# Patient Record
Sex: Male | Born: 1990
Health system: Southern US, Community
[De-identification: ages and names within clinical notes are randomized; demographics above are authoritative.]

---

## 2017-07-30 ENCOUNTER — Ambulatory Visit (INDEPENDENT_AMBULATORY_CARE_PROVIDER_SITE_OTHER): Payer: 59

## 2017-07-30 ENCOUNTER — Ambulatory Visit: Payer: 59 | Admitting: Physician Assistant

## 2017-07-30 ENCOUNTER — Encounter: Payer: Self-pay | Admitting: Physician Assistant

## 2017-07-30 VITALS — BP 140/90 | HR 78 | Temp 98.1°F | Resp 16 | Ht 67.0 in | Wt 252.0 lb

## 2017-07-30 DIAGNOSIS — R0789 Other chest pain: Secondary | ICD-10-CM

## 2017-07-30 DIAGNOSIS — R740 Nonspecific elevation of levels of transaminase and lactic acid dehydrogenase [LDH]: Secondary | ICD-10-CM

## 2017-07-30 DIAGNOSIS — R079 Chest pain, unspecified: Secondary | ICD-10-CM | POA: Diagnosis not present

## 2017-07-30 DIAGNOSIS — R7401 Elevation of levels of liver transaminase levels: Secondary | ICD-10-CM

## 2017-07-30 LAB — D-DIMER, QUANTITATIVE (NOT AT ARMC)

## 2017-07-30 LAB — TROPONIN I

## 2017-07-30 NOTE — Patient Instructions (Addendum)
I will call you tonight if any labs come back dangerous so you can report to the ED.  If they are normal than this pain is most likely musculoskeletal, for which you can take Aleve 2 caps in the monring and 2 at night for 5 days.     IF you received an x-ray today, you will receive an invoice from [facility]. Please contact [facility] at [phone] with questions or concerns regarding your invoice.   IF you received labwork today, you will receive an invoice from [facility]. Please contact [facility] at [phone] with questions or concerns regarding your invoice.   Our billing staff will not be able to assist you with questions regarding bills from these companies.  You will be contacted with the lab results as soon as they are available. The fastest way to get your results is to activate your My Chart account. Instructions are located on the last page of this paperwork. If you have not heard from us regarding the results in 2 weeks, please contact this office.

## 2017-07-30 NOTE — Progress Notes (Signed)
07/31/2017 8:18 AM   DOB: 1990/02/22 / MRN: [id number]  SUBJECTIVE:  Ernest Wright is a 27 y.o. male presenting for chest pain with associated shortness of breath. Symptoms present for about 3 days.  The problem is no better or worse. He has tried nothing.  Family history of cardiac disease in his father at age 25.  Denies history of diabetes, dyslipidemia and is a never smoker.  Denies leg swelling, orthopnea, presyncope.  Position changes to make the pain worse and illicit shortness of breath.  States his back was hurting after a basketball game just before this pain began.  That pain was also worse with movement  He has no allergies on file.   He  has no past medical history on file.    He  reports that he has never smoked. He has never used smokeless tobacco. He  has no sexual activity history on file. The patient  has no past surgical history on file.    ROS  The problem list and medications were reviewed and updated by myself where necessary and exist elsewhere in the encounter.   OBJECTIVE:  BP 140/90   Pulse 78   Temp 98.1 F (36.7 C) (Oral)   Resp 16   Ht 5\' 7"  (1.702 m)   Wt 252 lb (114.3 kg)   SpO2 97%   BMI 39.47 kg/m   Wt Readings from Last 3 Encounters:  07/30/17 252 lb (114.3 kg)   Temp Readings from Last 3 Encounters:  07/30/17 98.1 F (36.7 C) (Oral)   BP Readings from Last 3 Encounters:  07/30/17 140/90   Pulse Readings from Last 3 Encounters:  07/30/17 78    Physical Exam  Constitutional: He is oriented to person, place, and time. He appears well-developed. He is active.  Non-toxic appearance. He does not appear ill. No distress.  Eyes: Pupils are equal, round, and reactive to light. Conjunctivae and EOM are normal.  Cardiovascular: Normal rate, regular rhythm, S1 normal, S2 normal, normal heart sounds, intact distal pulses and normal pulses. Exam reveals no gallop and no friction rub.  No murmur heard. Pulmonary/Chest: Effort normal. No stridor.  No respiratory distress. He has no wheezes. He has no rales.  Abdominal: Soft. Normal appearance and bowel sounds are normal. He exhibits no distension and no mass. There is no tenderness. There is no rigidity, no rebound, no guarding and no CVA tenderness. No hernia.  Musculoskeletal: Normal range of motion. He exhibits no edema.  Neurological: He is alert and oriented to person, place, and time. He has normal strength and normal reflexes. He is not disoriented. No cranial nerve deficit or sensory deficit. He exhibits normal muscle tone. Coordination and gait normal.  Skin: Skin is warm and dry. He is not diaphoretic. No pallor.  Psychiatric: He has a normal mood and affect. His behavior is normal.  Nursing note and vitals reviewed.   Lab Results  Component Value Date   HGBA1C 5.3 07/30/2017    Lab Results  Component Value Date   WBC 8.1 07/30/2017   HGB 14.4 07/30/2017   HCT 42.5 07/30/2017   MCV 85 07/30/2017   PLT 188 07/30/2017    Lab Results  Component Value Date   CREATININE 0.98 07/30/2017   BUN 14 07/30/2017   NA 140 07/30/2017   K 4.4 07/30/2017   CL 104 07/30/2017   CO2 23 07/30/2017    Lab Results  Component Value Date   ALT 101 (H) 07/30/2017  AST 33 07/30/2017   ALKPHOS 80 07/30/2017   BILITOT 0.7 07/30/2017    No results found for: TSH  Lab Results  Component Value Date   CHOL 170 07/30/2017   HDL 46 07/30/2017   LDLCALC 111 (H) 07/30/2017   TRIG 63 07/30/2017   CHOLHDL 3.7 07/30/2017   Lab Results  Component Value Date   TROPONINI <0.01 07/30/2017   Lab Results  Component Value Date   DDIMER <0.20 07/30/2017     Dg Chest 2 View  Result Date: 07/30/2017 CLINICAL DATA:  Chest pain EXAM: CHEST - 2 VIEW COMPARISON:  None. FINDINGS: Lungs are clear. Heart size and pulmonary vascularity are normal. No adenopathy. No pneumothorax. No bone lesions. IMPRESSION: No abnormality noted. Electronically Signed   By: Bretta Bang III M.D.   On:  07/30/2017 14:52   EKG: Normal sinus rhythm without evidence of ischemia, infarction, hypertrophy.   ASSESSMENT AND PLAN:  Ernest Wright was seen today for chest pain and shortness of breath.  Diagnoses and all orders for this visit:  Chest pain, atypical: Patient with negative d-dimer, negative troponin, never smoker, negative for diabetes with a mild elevation in his LDL.  Patient is physically activ and without anye previous exertional chest pain.  He does tell me his back was hurting just before which was worse with movement.  This is most likely musculoskeletal in origin given all of his test including his EKG are normal.  Patient advised to take Aleve for 40 twice daily -     EKG 12-Lead -     Hemoglobin A1c -     Lipid panel -     CBC -     Comprehensive metabolic panel -     DG Chest 2 View; Future -     D-dimer, quantitative (not at [facility]) -     Troponin I    The patient is advised to call or return to clinic if he does not see an improvement in symptoms, or to seek the care of the closest emergency department if he worsens with the above plan.   Deliah Boston, MHS, PA-C [facility] 07/31/2017 8:18 AM

## 2017-07-31 ENCOUNTER — Encounter: Payer: Self-pay | Admitting: Radiology

## 2017-07-31 DIAGNOSIS — R740 Nonspecific elevation of levels of transaminase and lactic acid dehydrogenase [LDH]: Secondary | ICD-10-CM | POA: Diagnosis not present

## 2017-07-31 LAB — COMPREHENSIVE METABOLIC PANEL
ALBUMIN: 4.7 g/dL (ref 3.5–5.5)
ALT: 101 IU/L — ABNORMAL HIGH (ref 0–44)
AST: 33 IU/L (ref 0–40)
Albumin/Globulin Ratio: 1.8 (ref 1.2–2.2)
Alkaline Phosphatase: 80 IU/L (ref 39–117)
BUN / CREAT RATIO: 14 (ref 9–20)
BUN: 14 mg/dL (ref 6–20)
Bilirubin Total: 0.7 mg/dL (ref 0.0–1.2)
CALCIUM: 9.7 mg/dL (ref 8.7–10.2)
CO2: 23 mmol/L (ref 20–29)
CREATININE: 0.98 mg/dL (ref 0.76–1.27)
Chloride: 104 mmol/L (ref 96–106)
GFR, EST AFRICAN AMERICAN: 123 mL/min/{1.73_m2} (ref >59)
GFR, EST NON AFRICAN AMERICAN: 107 mL/min/{1.73_m2} (ref >59)
GLOBULIN, TOTAL: 2.6 g/dL (ref 1.5–4.5)
Glucose: 85 mg/dL (ref 65–99)
Potassium: 4.4 mmol/L (ref 3.5–5.2)
SODIUM: 140 mmol/L (ref 134–144)
TOTAL PROTEIN: 7.3 g/dL (ref 6.0–8.5)

## 2017-07-31 LAB — LIPID PANEL
CHOL/HDL RATIO: 3.7 ratio (ref 0.0–5.0)
Cholesterol, Total: 170 mg/dL (ref 100–199)
HDL: 46 mg/dL (ref >39)
LDL Calculated: 111 mg/dL — ABNORMAL HIGH (ref 0–99)
TRIGLYCERIDES: 63 mg/dL (ref 0–149)
VLDL Cholesterol Cal: 13 mg/dL (ref 5–40)

## 2017-07-31 LAB — HEMOGLOBIN A1C
Est. average glucose Bld gHb Est-mCnc: 105 mg/dL
HEMOGLOBIN A1C: 5.3 % (ref 4.8–5.6)

## 2017-07-31 LAB — CBC
HEMATOCRIT: 42.5 % (ref 37.5–51.0)
HEMOGLOBIN: 14.4 g/dL (ref 13.0–17.7)
MCH: 28.8 pg (ref 26.6–33.0)
MCHC: 33.9 g/dL (ref 31.5–35.7)
MCV: 85 fL (ref 79–97)
Platelets: 188 10*3/uL (ref 150–450)
RBC: 5 x10E6/uL (ref 4.14–5.80)
RDW: 12.5 % (ref 12.3–15.4)
WBC: 8.1 10*3/uL (ref 3.4–10.8)

## 2017-07-31 NOTE — Addendum Note (Signed)
Addended by: Garfield CorneaMABRY, Ameyah Bangura L on: 07/31/2017 03:29 PM   Modules accepted: Orders

## 2017-08-01 LAB — HEPATITIS PANEL, ACUTE
HEP A IGM: NEGATIVE
HEP B C IGM: NEGATIVE
HEP B S AG: NEGATIVE

## 2019-01-15 IMAGING — DX DG CHEST 2V
2 series · 2 of 2 positions shown · non-contrast
Comparison: None.

CLINICAL DATA: Chest pain

EXAM:
CHEST - 2 VIEW

[chest pa]
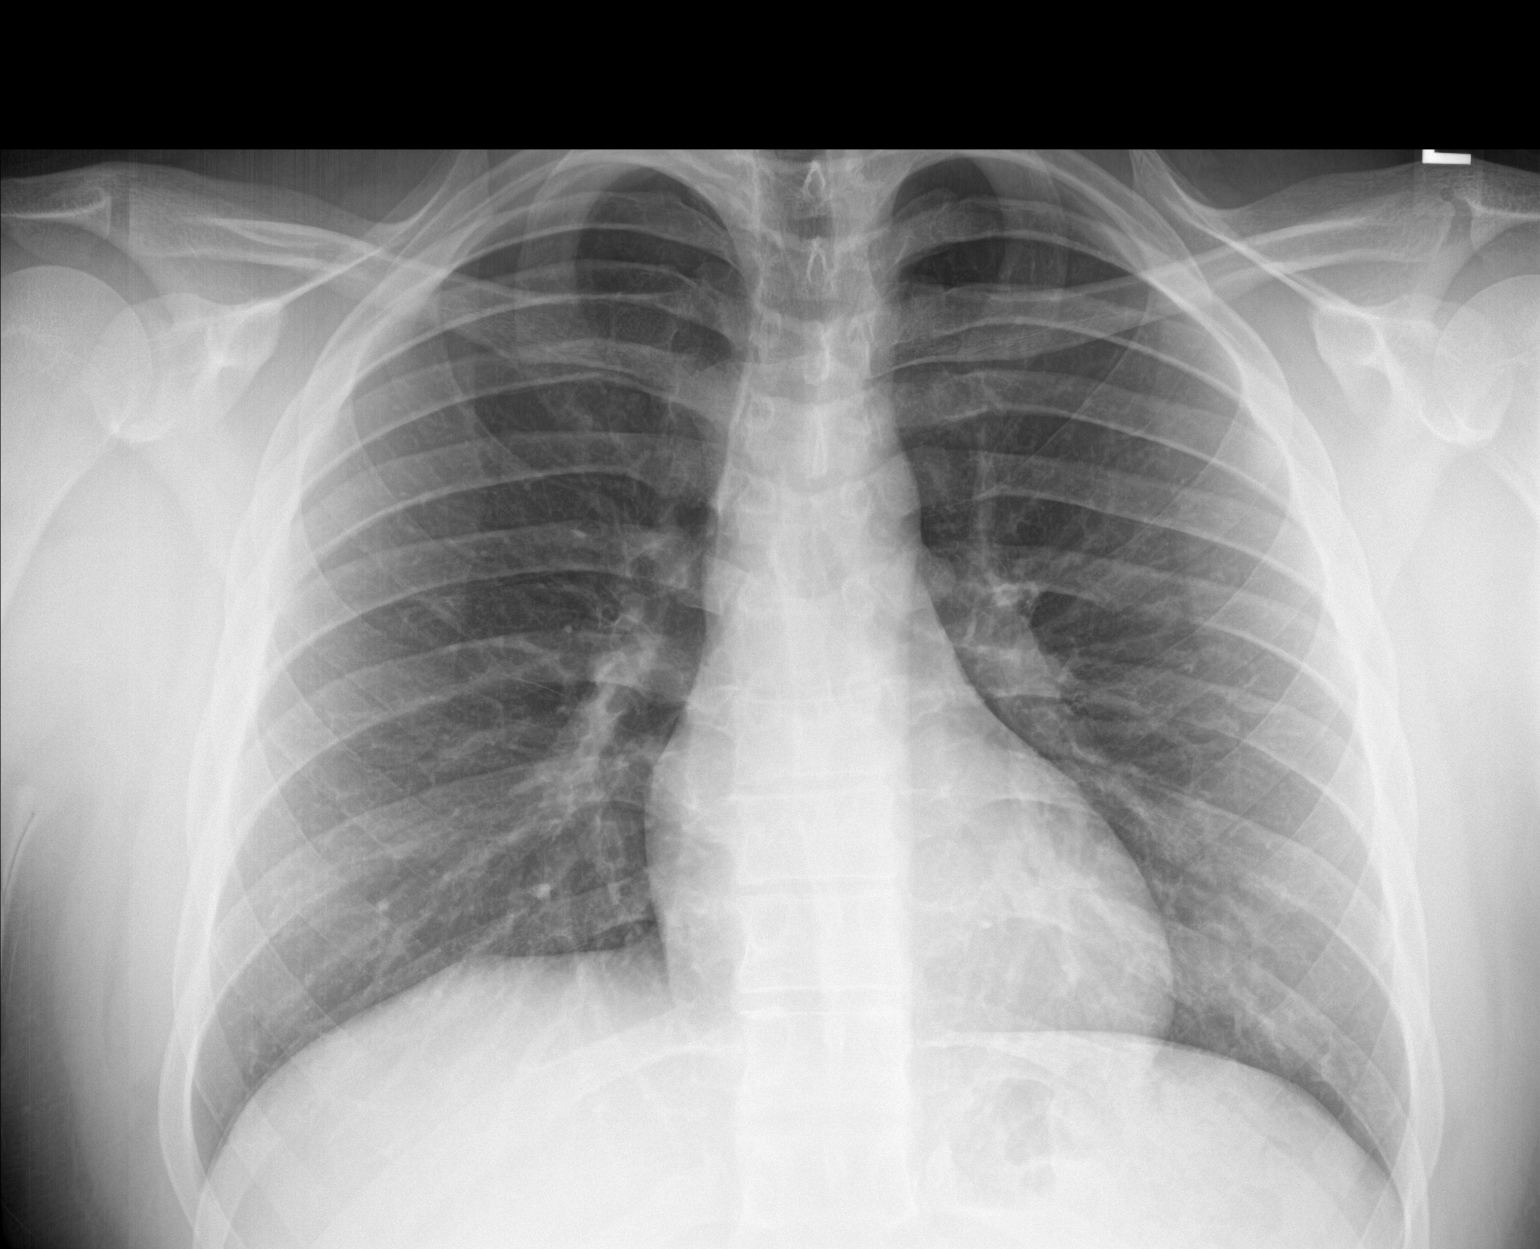

[chest lat]
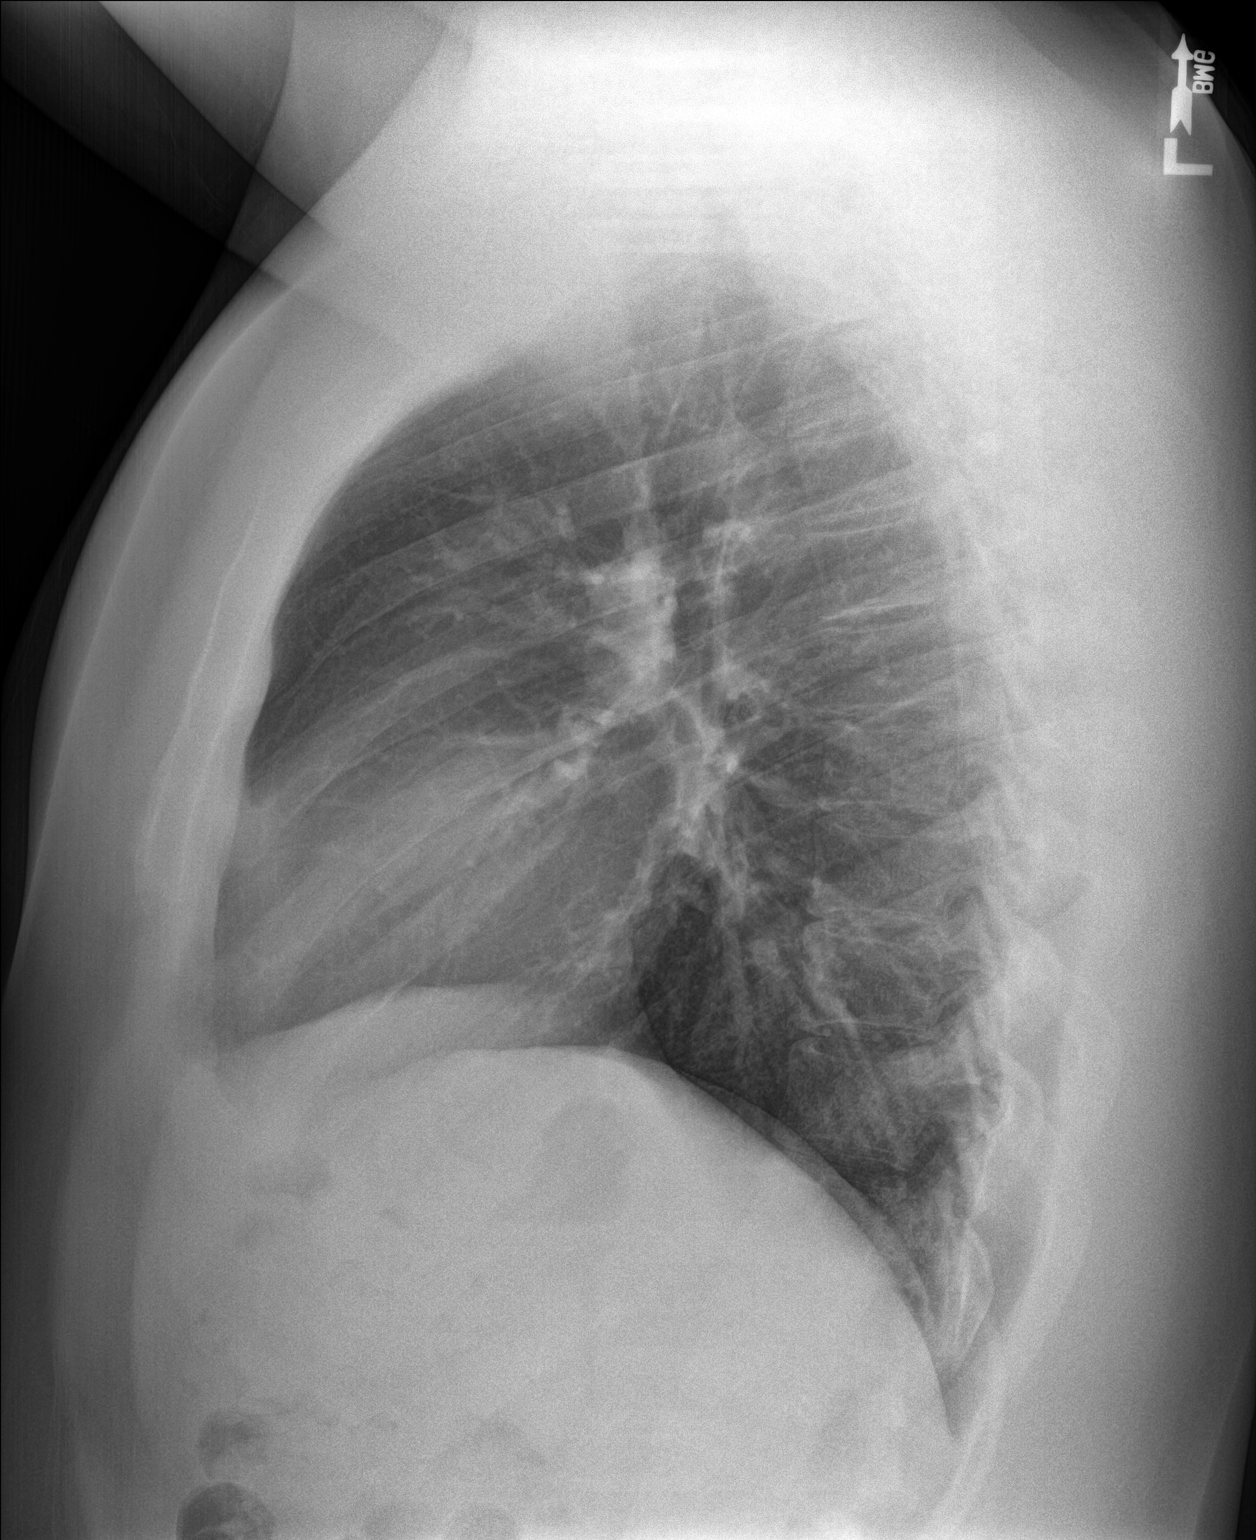

[2 of 2 positions shown; findings below may reference images not displayed]

FINDINGS: Lungs are clear. Heart size and pulmonary vascularity are normal. No
adenopathy. No pneumothorax. No bone lesions.
IMPRESSION: No abnormality noted.

## 2020-02-03 ENCOUNTER — Other Ambulatory Visit: Payer: Self-pay
